# Patient Record
Sex: Female | Born: 1985 | Race: Black or African American | Hispanic: No | Marital: Single | Smoking: Current some day smoker
Health system: Southern US, Community
[De-identification: ages and names within clinical notes are randomized; demographics above are authoritative.]

---

## 2017-07-21 ENCOUNTER — Other Ambulatory Visit: Payer: Self-pay

## 2017-07-21 ENCOUNTER — Encounter (HOSPITAL_COMMUNITY): Payer: Self-pay | Admitting: Emergency Medicine

## 2017-07-21 ENCOUNTER — Emergency Department (HOSPITAL_COMMUNITY)
Admission: EM | Admit: 2017-07-21 | Discharge: 2017-07-21 | Disposition: A | Discharge status: Home / Self Care | Payer: BLUE CROSS/BLUE SHIELD | Attending: Emergency Medicine | Admitting: Emergency Medicine

## 2017-07-21 DIAGNOSIS — F17210 Nicotine dependence, cigarettes, uncomplicated: Secondary | ICD-10-CM | POA: Insufficient documentation

## 2017-07-21 DIAGNOSIS — T7840XA Allergy, unspecified, initial encounter: Secondary | ICD-10-CM

## 2017-07-21 MED ORDER — PREDNISONE 20 MG PO TABS
40.0000 mg | ORAL_TABLET | Freq: Once | ORAL | Status: AC
  Administered 2017-07-21: 40 mg via ORAL
  Filled 2017-07-21: qty 2

## 2017-07-21 MED ORDER — PREDNISONE 20 MG PO TABS: 40.0000 mg | ORAL_TABLET | Freq: Every day | ORAL | 0 refills | Status: AC

## 2017-07-21 NOTE — Discharge Instructions (Signed)
If you should develop any of the following symptoms return to the emergency department immediately Increased swelling Increased difficulty breathing Any swelling of the tongue or the throat Any wheezing or increased shortness of breath  It is expected to have ongoing symptoms for several days but they should gradually get better.  Your rashes consistent with either an allergic reaction or a contact dermatitis.  It is likely that this is caused by the new laundry detergent that you have been using.  Please stop using this immediately and rewash all of your close in a neutral hypoallergenic laundry detergent.  Take prednisone daily for the next 5 days, 40 mg per dose Take Benadryl, 25 mg every 6 hours as needed for itching (may use 50 mg of 25 is not enough)

## 2017-07-21 NOTE — ED Notes (Signed)
EDP at bedside  

## 2017-07-21 NOTE — ED Notes (Signed)
Patient able to ambulate independently  

## 2017-07-21 NOTE — ED Provider Notes (Signed)
[facility]   CSN: [id number] Arrival date & time: 07/21/17  1611     History   Chief Complaint Chief Complaint  Patient presents with  . Allergic Reaction    HPI Gina Roberts is a 32 y.o. female.  HPI  32 year old female, she has no significant past medical history, she presents to the hospital today with swelling and rash over her face.  She reports this started 2 days ago, it was very mild, it started 24 hours after she changed her laundry detergent.  This is progressively worsened and currently the patient has diffuse facial swelling itching which is starting to spread onto her neck.  She has also had a couple other spots of itching on her arms, she denies any other changes in body products, food ingestions other than a [employer] burger which she states she usually does not eat.  No one else in the house has had similar symptoms, no pet exposures, no history of allergic reactions.  She has been taking Benadryl with minimal relief, denies shortness of breath, swelling of the tongue or difficulty speaking swallowing or breathing.  History reviewed. No pertinent past medical history.  There are no active problems to display for this patient.   History reviewed. No pertinent surgical history.  OB History    No data available       Home Medications    Prior to Admission medications   Medication Sig Start Date End Date Taking? Authorizing Provider  predniSONE (DELTASONE) 20 MG tablet Take 2 tablets (40 mg total) by mouth daily. 07/21/17   Eber HongMiller, Elliotte Marsalis, MD    Family History History reviewed. No pertinent family history.  Social History Social History   Tobacco Use  . Smoking status: Current Some Day Smoker    Packs/day: 0.10    Types: Cigarettes  . Smokeless tobacco: Never Used  Substance Use Topics  . Alcohol use: Not on file  . Drug use: Not on file     Allergies   Patient has no allergy information  on record.   Review of Systems Review of Systems  Constitutional: Negative for fever.  HENT: Positive for facial swelling.   Respiratory: Negative for shortness of breath and wheezing.   Skin: Positive for rash.     Physical Exam Updated Vital Signs BP 117/73 (BP Location: Right Arm)   Pulse 79   Temp 98 F (36.7 C) (Oral)   Resp 16   Ht 5\' 7"  (1.702 m)   Wt 66.2 kg (146 lb)   LMP 07/04/2017   SpO2 100%   BMI 22.87 kg/m   Physical Exam  Constitutional: She appears well-developed and well-nourished.  HENT:  Head: Normocephalic and atraumatic.  Eyes: Conjunctivae are normal. Right eye exhibits no discharge. Left eye exhibits no discharge.  Pulmonary/Chest: Effort normal. No respiratory distress.  Neurological: She is alert. Coordination normal.  Skin: Skin is warm and dry. Rash noted. She is not diaphoretic. No erythema.  The face is mild diffusely swollen, there is no significant swelling of the lips the tongue or the oral cavity, there is mild warmth to the skin, it is symmetrical, it is periorbital, perioral, extending down onto the neck.  It is maculopapular but no petechiae or purpura, no pustules  Psychiatric: She has a normal mood and affect.  Nursing note and vitals reviewed.    ED Treatments / Results  Labs (all labs ordered are listed, but only abnormal results are displayed) Labs  Reviewed - No data to display  Radiology No results found.  Procedures Procedures (including critical care time)  Medications Ordered in ED Medications  predniSONE (DELTASONE) tablet 40 mg (40 mg Oral Given 07/21/17 1918)     Initial Impression / Assessment and Plan / ED Course  I have reviewed the triage vital signs and the nursing notes.  Pertinent labs & imaging results that were available during my care of the patient were reviewed by me and considered in my medical decision making (see chart for details).     Exam is consistent with an acute allergic reaction  likely to the laundry detergent.  The patient was counseled exactly on avoiding the same laundry detergent, re-washing her close, Benadryl and prednisone, prednisone will be given in the ED.  Patient expressed understanding.  The patient tolerated the medications without any difficulty whatsoever.  She appears stable for discharge.  This is either a contact dermatitis or a allergic reaction, either way she has been counseled on the use of Benadryl, prednisone and reclining all of her close.  Final Clinical Impressions(s) / ED Diagnoses   Final diagnoses:  Allergic reaction, initial encounter    ED Discharge Orders        Ordered    predniSONE (DELTASONE) 20 MG tablet  Daily     07/21/17 2041       Eber Hong, MD 07/21/17 2043

## 2017-07-21 NOTE — ED Triage Notes (Signed)
Pt to ER for allergic reaction onset Thursday that has progressively gotten worse, no difficulty breathing, airway intact, speaking in clear sentences. Pt states took 25 mg benadryl prior to arrival. Pt in NAD. Facial swelling noted. Lungs clear, no stridor or wheezing, no oropharyngeal swelling noted.

## 2017-09-06 ENCOUNTER — Emergency Department (HOSPITAL_COMMUNITY): Payer: Medicaid Other

## 2017-09-06 ENCOUNTER — Other Ambulatory Visit: Payer: Self-pay

## 2017-09-06 ENCOUNTER — Encounter (HOSPITAL_COMMUNITY): Payer: Self-pay | Admitting: Radiology

## 2017-09-06 ENCOUNTER — Emergency Department (HOSPITAL_COMMUNITY)
Admission: EM | Admit: 2017-09-06 | Discharge: 2017-09-06 | Disposition: A | Discharge status: Home / Self Care | Payer: Medicaid Other | Attending: Emergency Medicine | Admitting: Emergency Medicine

## 2017-09-06 DIAGNOSIS — Z79899 Other long term (current) drug therapy: Secondary | ICD-10-CM | POA: Insufficient documentation

## 2017-09-06 DIAGNOSIS — R109 Unspecified abdominal pain: Secondary | ICD-10-CM

## 2017-09-06 DIAGNOSIS — F17210 Nicotine dependence, cigarettes, uncomplicated: Secondary | ICD-10-CM | POA: Diagnosis not present

## 2017-09-06 DIAGNOSIS — K529 Noninfective gastroenteritis and colitis, unspecified: Secondary | ICD-10-CM

## 2017-09-06 DIAGNOSIS — R112 Nausea with vomiting, unspecified: Secondary | ICD-10-CM | POA: Diagnosis present

## 2017-09-06 DIAGNOSIS — R1084 Generalized abdominal pain: Secondary | ICD-10-CM | POA: Diagnosis not present

## 2017-09-06 LAB — URINALYSIS, ROUTINE W REFLEX MICROSCOPIC
BILIRUBIN URINE: NEGATIVE
Glucose, UA: NEGATIVE mg/dL
KETONES UR: NEGATIVE mg/dL
LEUKOCYTES UA: NEGATIVE
Nitrite: NEGATIVE
PH: 5 (ref 5.0–8.0)
PROTEIN: NEGATIVE mg/dL
Specific Gravity, Urine: 1.031 — ABNORMAL HIGH (ref 1.005–1.030)

## 2017-09-06 LAB — CBC
HEMATOCRIT: 39.3 % (ref 36.0–46.0)
HEMOGLOBIN: 12.4 g/dL (ref 12.0–15.0)
MCH: 26.6 pg (ref 26.0–34.0)
MCHC: 31.6 g/dL (ref 30.0–36.0)
MCV: 84.3 fL (ref 78.0–100.0)
Platelets: 217 10*3/uL (ref 150–400)
RBC: 4.66 MIL/uL (ref 3.87–5.11)
RDW: 13.5 % (ref 11.5–15.5)
WBC: 6.9 10*3/uL (ref 4.0–10.5)

## 2017-09-06 LAB — I-STAT BETA HCG BLOOD, ED (MC, WL, AP ONLY): I-stat hCG, quantitative: <5 m[IU]/mL (ref <5)

## 2017-09-06 LAB — COMPREHENSIVE METABOLIC PANEL
ALT: 12 U/L — AB (ref 14–54)
ANION GAP: 8 (ref 5–15)
AST: 17 U/L (ref 15–41)
Albumin: 3.7 g/dL (ref 3.5–5.0)
Alkaline Phosphatase: 51 U/L (ref 38–126)
BUN: 9 mg/dL (ref 6–20)
CHLORIDE: 106 mmol/L (ref 101–111)
CO2: 25 mmol/L (ref 22–32)
CREATININE: 0.92 mg/dL (ref 0.44–1.00)
Calcium: 8.7 mg/dL — ABNORMAL LOW (ref 8.9–10.3)
GFR calc Af Amer: >60 mL/min (ref >60)
Glucose, Bld: 89 mg/dL (ref 65–99)
POTASSIUM: 3.6 mmol/L (ref 3.5–5.1)
SODIUM: 139 mmol/L (ref 135–145)
Total Bilirubin: 0.7 mg/dL (ref 0.3–1.2)
Total Protein: 6.9 g/dL (ref 6.5–8.1)

## 2017-09-06 LAB — LIPASE, BLOOD: LIPASE: 23 U/L (ref 11–51)

## 2017-09-06 MED ORDER — IOPAMIDOL (ISOVUE-300) INJECTION 61%
INTRAVENOUS | Status: AC
  Filled 2017-09-06: qty 100

## 2017-09-06 MED ORDER — CIPROFLOXACIN HCL 500 MG PO TABS: 500.0000 mg | ORAL_TABLET | Freq: Two times a day (BID) | ORAL | 0 refills | Status: AC

## 2017-09-06 MED ORDER — ONDANSETRON HCL 4 MG/2ML IJ SOLN
4.0000 mg | Freq: Once | INTRAMUSCULAR | Status: AC
  Administered 2017-09-06: 4 mg via INTRAVENOUS
  Filled 2017-09-06: qty 2

## 2017-09-06 MED ORDER — IOPAMIDOL (ISOVUE-300) INJECTION 61%
100.0000 mL | Freq: Once | INTRAVENOUS | Status: AC | PRN
  Administered 2017-09-06: 100 mL via INTRAVENOUS

## 2017-09-06 MED ORDER — ONDANSETRON HCL 4 MG PO TABS
4.0000 mg | ORAL_TABLET | Freq: Four times a day (QID) | ORAL | 0 refills | Status: AC
Start: 2017-09-06 — End: ?

## 2017-09-06 MED ORDER — KETOROLAC TROMETHAMINE 30 MG/ML IJ SOLN
30.0000 mg | Freq: Once | INTRAMUSCULAR | Status: AC
  Administered 2017-09-06: 30 mg via INTRAVENOUS
  Filled 2017-09-06: qty 1

## 2017-09-06 MED ORDER — METRONIDAZOLE 500 MG PO TABS: 500.0000 mg | ORAL_TABLET | Freq: Two times a day (BID) | ORAL | 0 refills | Status: AC

## 2017-09-06 MED ORDER — SODIUM CHLORIDE 0.9 % IV BOLUS: 1000.0000 mL | Freq: Once | INTRAVENOUS | Status: DC

## 2017-09-06 NOTE — ED Provider Notes (Signed)
Care assumed from PA AdamsJeff Hedges at shift change, please see his note for full details, but in brief Gina Roberts is a 32 y.o. female presents for evaluation of 2 days of abdominal pain, nausea and vomiting.  Patient focally tender in the right lower quadrant on exam.  Lab evaluation overall reassuring.   Plan: Follow up on CT results of CT does not show any acute findings patient stable for discharge home with antiemetics and return precautions.  Ct Abdomen Pelvis W Contrast  Result Date: 09/06/2017 CLINICAL DATA:  Abdominal pain with nausea and vomiting. Appendicitis suspected. EXAM: CT ABDOMEN AND PELVIS WITH CONTRAST TECHNIQUE: Multidetector CT imaging of the abdomen and pelvis was performed using the standard protocol following bolus administration of intravenous contrast. CONTRAST:  100mL ISOVUE-300 IOPAMIDOL (ISOVUE-300) INJECTION 61% COMPARISON:  None. FINDINGS: Lower chest: Small pericardial effusion inferiorly. No focal consolidation or pleural fluid. Hepatobiliary: No focal liver abnormality is seen. No gallstones, gallbladder wall thickening, or biliary dilatation. Pancreas: No ductal dilatation or inflammation. Spleen: Normal in size without focal abnormality. Adrenals/Urinary Tract: Normal adrenal glands. No hydronephrosis or perinephric edema. Homogeneous renal enhancement. Early excretion of IV contrast in the renal collecting system obscures evaluation for renal stones. Urinary bladder is physiologically distended without wall thickening. Stomach/Bowel: Lack of enteric contrast and paucity of intra-abdominal fat limits bowel assessment, particularly in the pelvis. The stomach is nondistended. No bowel dilatation to suggest obstruction. Scattered fluid-filled small bowel loops without wall thickening or perienteric inflammation. Appendix not identified, no pericecal or right lower quadrant inflammation. Small volume of stool throughout the colon, majority of the colon is decompressed. No  colonic wall thickening or inflammation. Vascular/Lymphatic: No significant vascular findings are present. No enlarged abdominal or pelvic lymph nodes. Reproductive: Retroverted uterus. Suspect physiologic corpus luteal cyst in the right ovary. No evidence of adnexal mass. Other: Small amount of free fluid in the pelvis. No upper abdominal ascites. No free air. No intra-abdominal abscess. Musculoskeletal: There are no acute or suspicious osseous abnormalities. IMPRESSION: 1. No evidence of appendicitis. 2. Scattered fluid-filled nondilated or inflamed small bowel loops can be seen with enteritis or ileus. Electronically Signed   By: Rubye OaksMelanie  Ehinger M.D.   On: 09/06/2017 22:05    CT without any evidence of appendicitis, does show some scattered fluid-filled nondilated or inflamed small bowel loops which can be seen with enteritis versus ileus.  I think given patient's symptoms of nausea vomiting and generalized mild abdominal pain is more likely that this is enteritis.  Discussed CT findings with Dr. Rush Landmarkegeler.  Will treat with Cipro and Flagyl for any bacterial infectious enteritis.  Patient provided with Zofran prescription as well.  She is tolerating p.o. here in the ED and reports improvement in pain.  Patient is stable for discharge home with follow-up with her primary care provider at this time.  Vitals normal and patient in no acute distress at discharge.   Dartha LodgeFord, [name], PA-C 09/07/17 0030    Tegeler, Canary Brimhristopher J, MD 09/07/17 [phone]

## 2017-09-06 NOTE — ED Triage Notes (Signed)
Pt in c/o n/v, worse with smells at work, reports feeling weak at times and dizzy, no distress noted

## 2017-09-06 NOTE — Discharge Instructions (Addendum)
Please read attached information. If you experience any new or worsening signs or symptoms please return to the emergency room for evaluation. Please follow-up with your primary care provider or specialist as discussed. Please use medication prescribed only as directed and discontinue taking if you have any concerning signs or symptoms.  Please complete course of ciprofloxacin and Flagyl to help with enteritis as showed on your CT scan.

## 2017-09-06 NOTE — ED Provider Notes (Signed)
[facility] Provider Note   CSN: [id number] Arrival date & time: 09/06/17  1548     History   Chief Complaint Chief Complaint  Patient presents with  . Emesis    HPI Gina Roberts is a 32 y.o. female.  HPI   32 year old female presents today with complaints of nausea vomiting and abdominal pain.  Patient reports her last 2 days she is felt extremely nauseous with right lower quadrant abdominal pain.  Patient notes the pain was generalized in nature originally.  She denies any diarrhea, reports vomiting is worse after certain smells while at work.  She denies any fever, she denies any vaginal discharge or bleeding.  Patient denies any urinary complaints.    History reviewed. No pertinent past medical history.  There are no active problems to display for this patient.   No past surgical history on file.   OB History   None      Home Medications    Prior to Admission medications   Medication Sig Start Date End Date Taking? Authorizing Provider  ondansetron (ZOFRAN) 4 MG tablet Take 1 tablet (4 mg total) by mouth every 6 (six) hours. 09/06/17   Teddy Rebstock, Tinnie GensJeffrey, PA-C  predniSONE (DELTASONE) 20 MG tablet Take 2 tablets (40 mg total) by mouth daily. 07/21/17   Eber HongMiller, Brian, MD    Family History No family history on file.  Social History Social History   Tobacco Use  . Smoking status: Current Some Day Smoker    Packs/day: 0.10    Types: Cigarettes  . Smokeless tobacco: Never Used  Substance Use Topics  . Alcohol use: Not on file  . Drug use: Not on file     Allergies   Patient has no known allergies.   Review of Systems Review of Systems  All other systems reviewed and are negative.   Physical Exam Updated Vital Signs BP 105/69 (BP Location: Right Arm)   Pulse 79   Temp 99.1 F (37.3 C) (Oral)   Resp 16   SpO2 99%   Physical Exam  Constitutional: She is oriented to person, place, and time. She appears  well-developed and well-nourished.  HENT:  Head: Normocephalic and atraumatic.  Eyes: Pupils are equal, round, and reactive to light. Conjunctivae are normal. Right eye exhibits no discharge. Left eye exhibits no discharge. No scleral icterus.  Neck: Normal range of motion. No JVD present. No tracheal deviation present.  Pulmonary/Chest: Effort normal. No stridor.  Abdominal:  Generalized tenderness worse in the right lower quadrant-no rebound or guarding  Neurological: She is alert and oriented to person, place, and time. Coordination normal.  Psychiatric: She has a normal mood and affect. Her behavior is normal. Judgment and thought content normal.  Nursing note and vitals reviewed.    ED Treatments / Results  Labs (all labs ordered are listed, but only abnormal results are displayed) Labs Reviewed  COMPREHENSIVE METABOLIC PANEL - Abnormal; Notable for the following components:      Result Value   Calcium 8.7 (*)    ALT 12 (*)    All other components within normal limits  URINALYSIS, ROUTINE W REFLEX MICROSCOPIC - Abnormal; Notable for the following components:   APPearance CLOUDY (*)    Specific Gravity, Urine 1.031 (*)    Hgb urine dipstick SMALL (*)    Bacteria, UA RARE (*)    Squamous Epithelial / LPF TOO NUMEROUS TO COUNT (*)    All other components within normal limits  LIPASE, BLOOD  CBC  I-STAT BETA HCG BLOOD, ED ([facility], [facility], [facility] ONLY)    EKG None  Radiology No results found.  Procedures Procedures (including critical care time)  Medications Ordered in ED Medications  iopamidol (ISOVUE-300) 61 % injection (has no administration in time range)  sodium chloride 0.9 % bolus 1,000 mL (has no administration in time range)  ondansetron (ZOFRAN) injection 4 mg (4 mg Intravenous Given 09/06/17 2101)  ketorolac (TORADOL) 30 MG/ML injection 30 mg (30 mg Intravenous Given 09/06/17 2101)  iopamidol (ISOVUE-300) 61 % injection 100 mL (100 mLs Intravenous Contrast Given 09/06/17  2141)     Initial Impression / Assessment and Plan / ED Course  I have reviewed the triage vital signs and the nursing notes.  Pertinent labs & imaging results that were available during my care of the patient were reviewed by me and considered in my medical decision making (see chart for details).     Final Clinical Impressions(s) / ED Diagnoses   Final diagnoses:  Abdominal pain, unspecified abdominal location    Labs: UA, I stat beta HCG, Lipase, CMP  Imaging: CT abdomen pelvis with contrast  Consults:  Therapeutics: toradol   Discharge Meds:   Assessment/Plan: 32 year old female presents today with complaints of nausea vomiting abdominal pain.  Patient reports several day history, she is afebrile with no tachycardia.  She has no elevation in white count.  She is tender in the right lower quadrant, CT scan will be obtained to rule out any significant intra-abdominal infection.  Patient has no significant pelvic pain, no vaginal discharge or bleeding.  CT scan pending patient care transition to oncoming provider pending CT results and disposition.     ED Discharge Orders        Ordered    ondansetron (ZOFRAN) 4 MG tablet  Every 6 hours     09/06/17 2157       Rosalio Loud 09/06/17 2159    Azalia Bilis, MD 09/06/17 2257

## 2018-05-23 IMAGING — CT CT ABD-PELV W/ CM
2 of 4 series · 15 of 46 positions shown, 17 images · IV contrast (iopamidol)
Comparison: None.

CLINICAL DATA: Abdominal pain with nausea and vomiting.
Appendicitis suspected.

EXAM:
CT ABDOMEN AND PELVIS WITH CONTRAST
TECHNIQUE: Multidetector CT imaging of the abdomen and pelvis was performed
using the standard protocol following bolus administration of
intravenous contrast.
CONTRAST:  100mL FZR8RA-BTT IOPAMIDOL (FZR8RA-BTT) INJECTION 61%

[Series 3: abdomen 5.0 · axial · 0.65mm/px · z∈[-490,-115]mm · 12 of 89 slices shown, 14 images]
[im 7/89  soft-tissue]
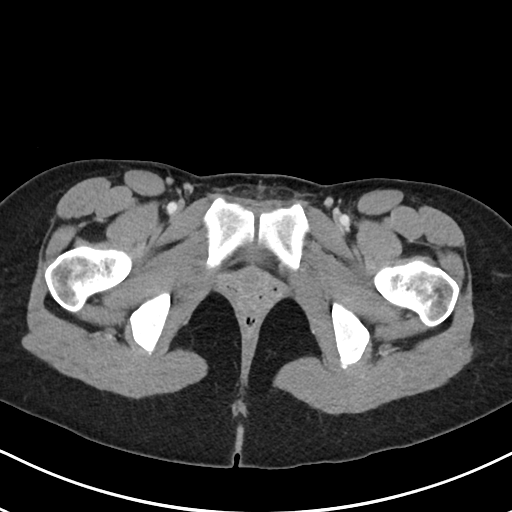
[im 7/89  bone]
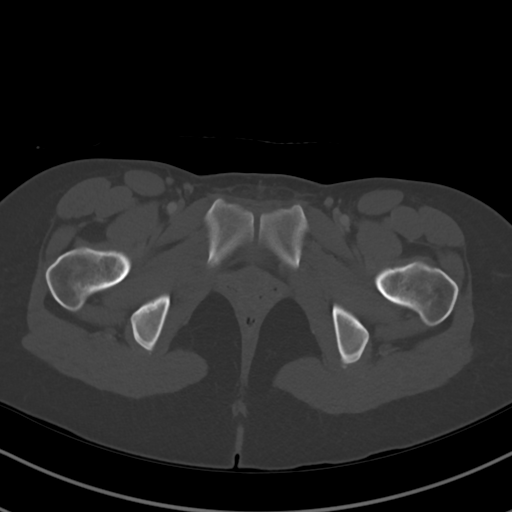
[im 13/89  soft-tissue]
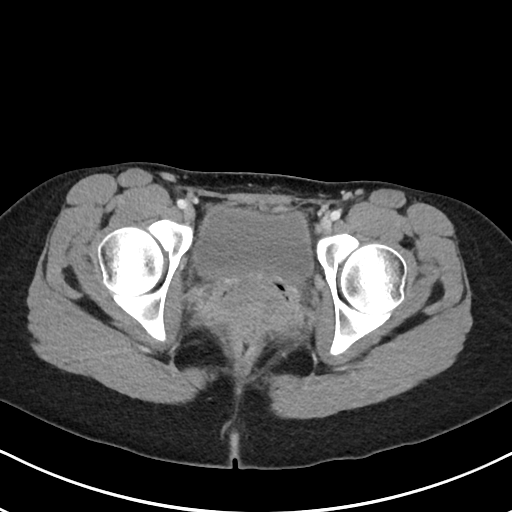
[im 19/89  soft-tissue]
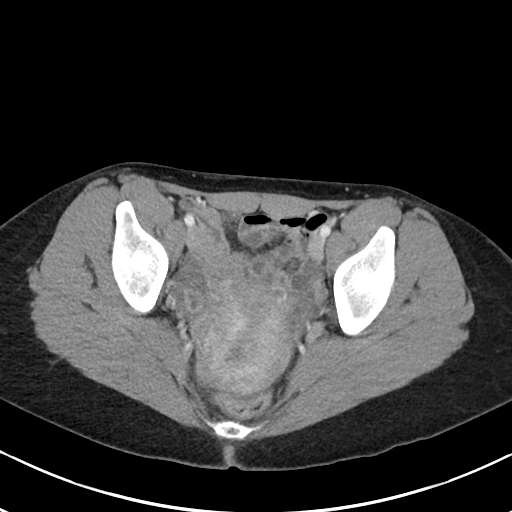
[im 26/89  soft-tissue]
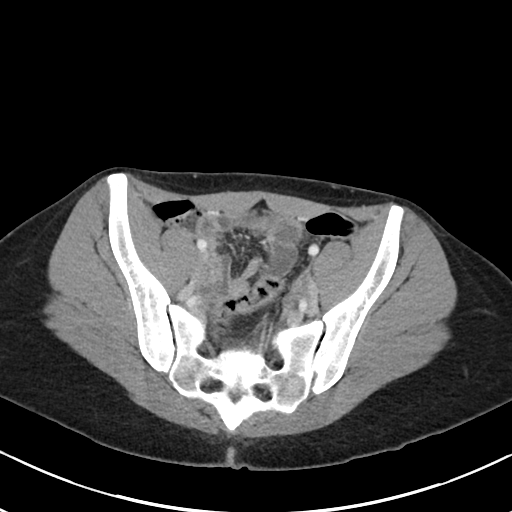
[im 32/89  soft-tissue]
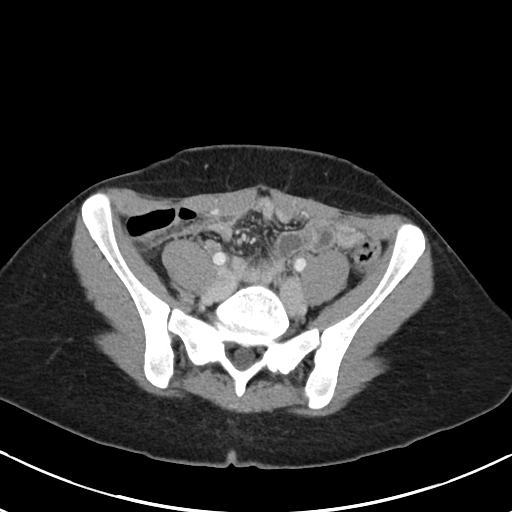
[im 38/89  soft-tissue]
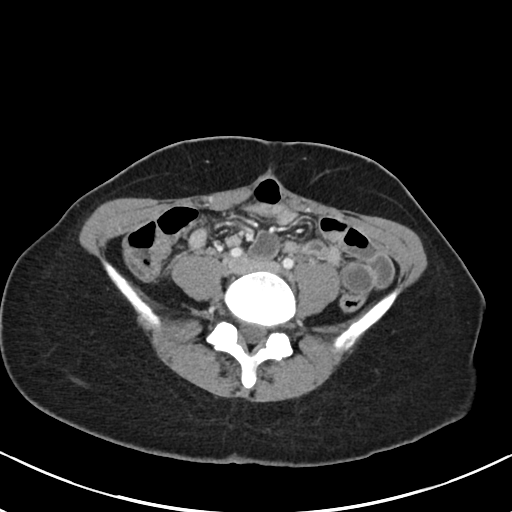
[im 51/89  soft-tissue]
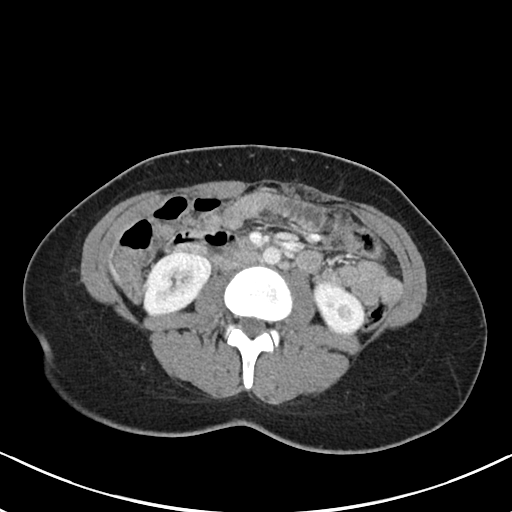
[im 57/89  soft-tissue]
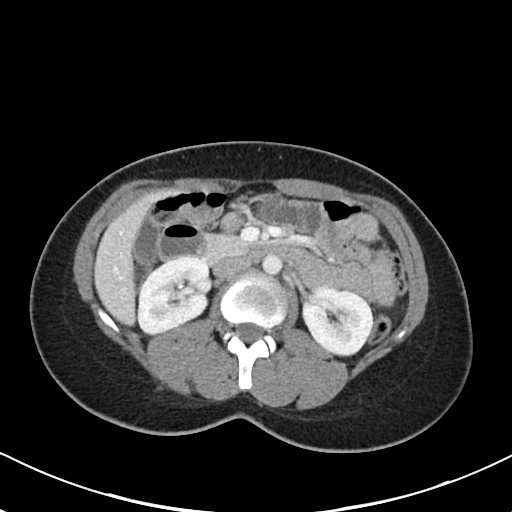
[im 63/89  soft-tissue]
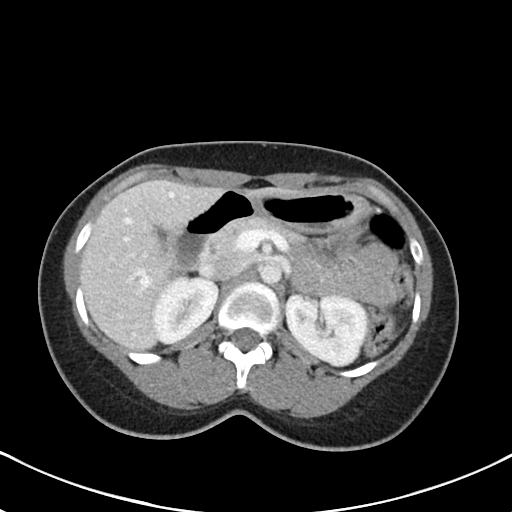
[im 63/89  bone]
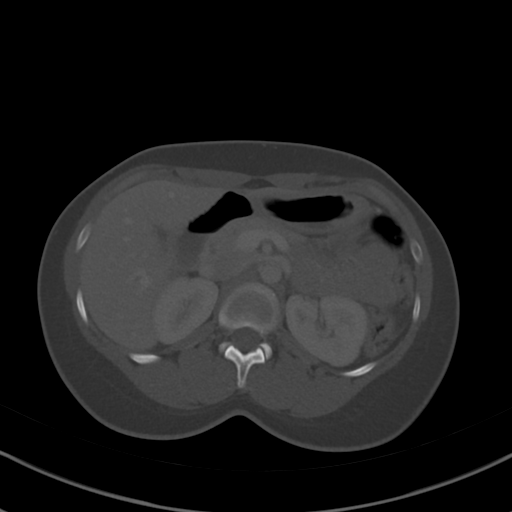
[im 70/89  soft-tissue]
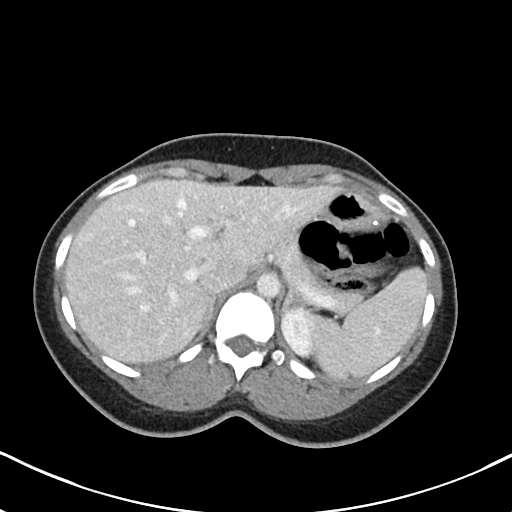
[im 76/89  soft-tissue]
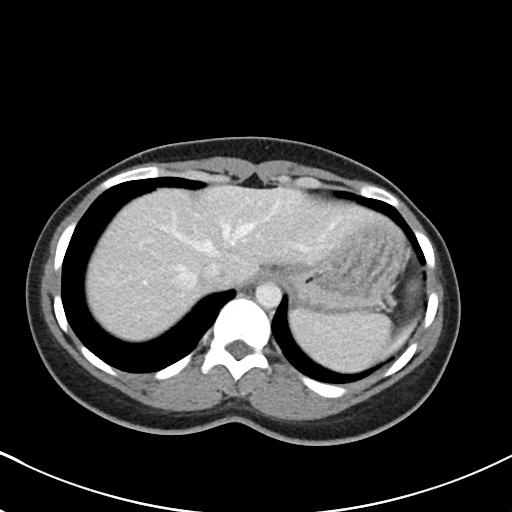
[im 82/89  soft-tissue]
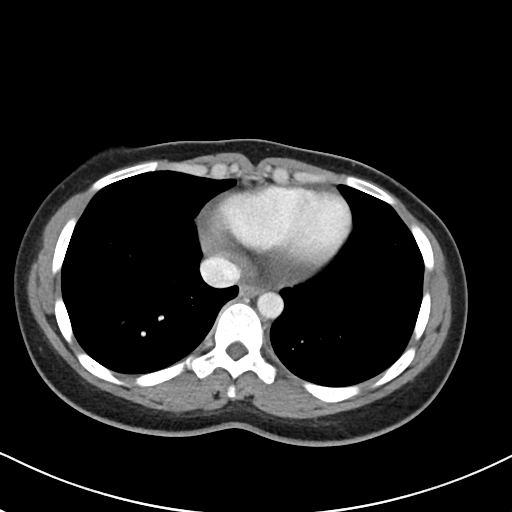

[Series 6: abdomen 3.0 mpr cor · coronal · 0.61mm/px · 3 of 70 slices shown]
[im 24/70  soft-tissue]
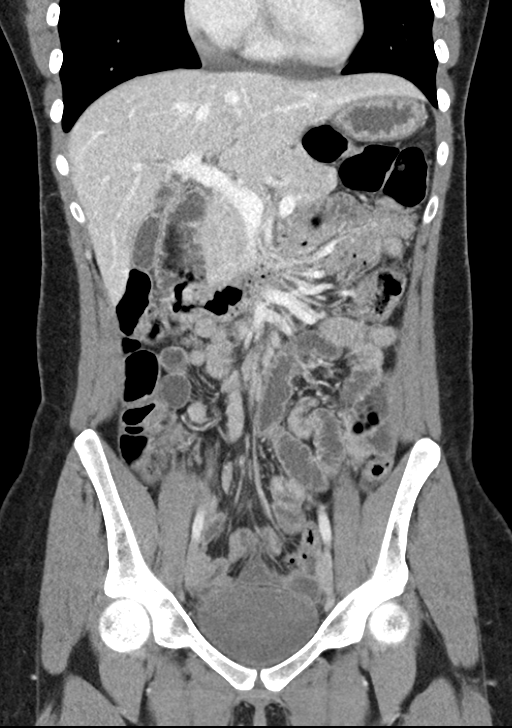
[im 31/70  soft-tissue]
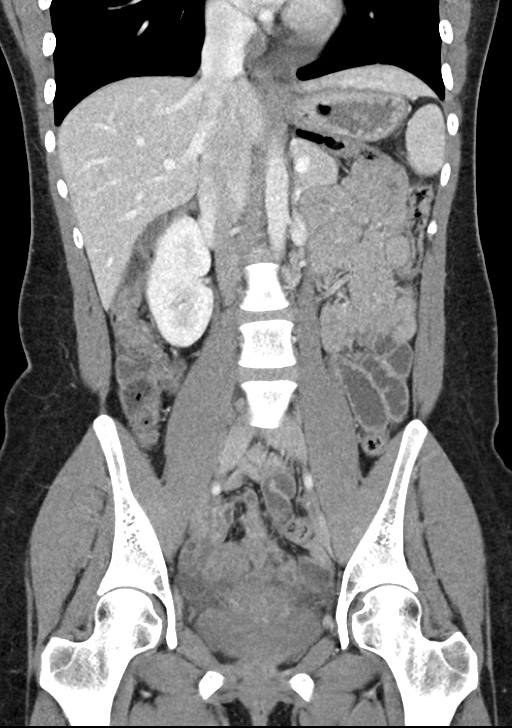
[im 39/70  soft-tissue]
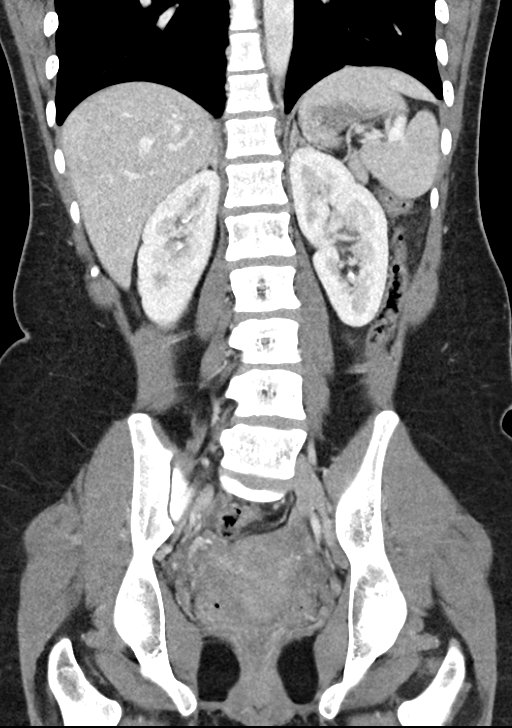

[15 of 46 positions shown; findings below may reference images not displayed]

FINDINGS: Lower chest: Small pericardial effusion inferiorly. No focal
consolidation or pleural fluid.

Hepatobiliary: No focal liver abnormality is seen. No gallstones,
gallbladder wall thickening, or biliary dilatation.

Pancreas: No ductal dilatation or inflammation.

Spleen: Normal in size without focal abnormality.

Adrenals/Urinary Tract: Normal adrenal glands. No hydronephrosis or
perinephric edema. Homogeneous renal enhancement. Early excretion of
IV contrast in the renal collecting system obscures evaluation for
renal stones. Urinary bladder is physiologically distended without
wall thickening.

Stomach/Bowel: Lack of enteric contrast and paucity of
intra-abdominal fat limits bowel assessment, particularly in the
pelvis. The stomach is nondistended. No bowel dilatation to suggest
obstruction. Scattered fluid-filled small bowel loops without wall
thickening or perienteric inflammation. Appendix not identified, no
pericecal or right lower quadrant inflammation. Small volume of
stool throughout the colon, majority of the colon is decompressed.
No colonic wall thickening or inflammation.

Vascular/Lymphatic: No significant vascular findings are present. No
enlarged abdominal or pelvic lymph nodes.

Reproductive: Retroverted uterus. Suspect physiologic corpus luteal
cyst in the right ovary. No evidence of adnexal mass.

Other: Small amount of free fluid in the pelvis. No upper abdominal
ascites. No free air. No intra-abdominal abscess.

Musculoskeletal: There are no acute or suspicious osseous
abnormalities.
IMPRESSION: 1. No evidence of appendicitis.
2. Scattered fluid-filled nondilated or inflamed small bowel loops
can be seen with enteritis or ileus.
